# Patient Record
Sex: Male | Born: 1960 | Race: Black or African American | Hispanic: No | Marital: Single | Smoking: Former smoker
Health system: Southern US, Community
[De-identification: ages and names within clinical notes are randomized; demographics above are authoritative.]

---

## 2010-08-26 ENCOUNTER — Emergency Department (HOSPITAL_COMMUNITY): Payer: Self-pay

## 2010-08-26 ENCOUNTER — Emergency Department (HOSPITAL_COMMUNITY)
Admission: EM | Admit: 2010-08-26 | Discharge: 2010-08-26 | Disposition: A | Discharge status: Home / Self Care | Payer: Self-pay | Attending: Emergency Medicine | Admitting: Emergency Medicine

## 2010-08-26 DIAGNOSIS — M79609 Pain in unspecified limb: Secondary | ICD-10-CM | POA: Insufficient documentation

## 2010-08-26 DIAGNOSIS — S61409A Unspecified open wound of unspecified hand, initial encounter: Secondary | ICD-10-CM | POA: Insufficient documentation

## 2010-08-26 DIAGNOSIS — W269XXA Contact with unspecified sharp object(s), initial encounter: Secondary | ICD-10-CM | POA: Insufficient documentation

## 2010-08-26 DIAGNOSIS — Y92009 Unspecified place in unspecified non-institutional (private) residence as the place of occurrence of the external cause: Secondary | ICD-10-CM | POA: Insufficient documentation

## 2013-01-06 ENCOUNTER — Emergency Department (HOSPITAL_COMMUNITY)

## 2013-01-06 ENCOUNTER — Emergency Department (HOSPITAL_COMMUNITY)
Admission: EM | Admit: 2013-01-06 | Discharge: 2013-01-06 | Disposition: A | Discharge status: Home / Self Care | Attending: Emergency Medicine | Admitting: Emergency Medicine

## 2013-01-06 ENCOUNTER — Encounter (HOSPITAL_COMMUNITY): Payer: Self-pay | Admitting: Emergency Medicine

## 2013-01-06 DIAGNOSIS — F17200 Nicotine dependence, unspecified, uncomplicated: Secondary | ICD-10-CM | POA: Insufficient documentation

## 2013-01-06 DIAGNOSIS — S62319A Displaced fracture of base of unspecified metacarpal bone, initial encounter for closed fracture: Secondary | ICD-10-CM | POA: Insufficient documentation

## 2013-01-06 DIAGNOSIS — S62308A Unspecified fracture of other metacarpal bone, initial encounter for closed fracture: Secondary | ICD-10-CM

## 2013-01-06 MED ORDER — HYDROCODONE-ACETAMINOPHEN 5-325 MG PO TABS
1.0000 | ORAL_TABLET | Freq: Once | ORAL | Status: AC
  Administered 2013-01-06: 1 via ORAL
  Filled 2013-01-06: qty 1
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
## 2013-01-07 NOTE — ED Provider Notes (Signed)
Medical screening examination/treatment/procedure(s) were performed by non-physician practitioner and as supervising physician I was immediately available for consultation/collaboration.  Hurman Horn, MD 01/07/13 [phone]

## 2013-06-13 ENCOUNTER — Emergency Department (HOSPITAL_COMMUNITY)

## 2013-06-13 ENCOUNTER — Emergency Department (HOSPITAL_COMMUNITY)
Admission: EM | Admit: 2013-06-13 | Discharge: 2013-06-13 | Disposition: A | Discharge status: Home / Self Care | Attending: Emergency Medicine | Admitting: Emergency Medicine

## 2013-06-13 ENCOUNTER — Encounter (HOSPITAL_COMMUNITY): Payer: Self-pay | Admitting: Emergency Medicine

## 2013-06-13 DIAGNOSIS — IMO0002 Reserved for concepts with insufficient information to code with codable children: Secondary | ICD-10-CM | POA: Insufficient documentation

## 2013-06-13 DIAGNOSIS — Z23 Encounter for immunization: Secondary | ICD-10-CM | POA: Insufficient documentation

## 2013-06-13 DIAGNOSIS — S92402B Displaced unspecified fracture of left great toe, initial encounter for open fracture: Secondary | ICD-10-CM

## 2013-06-13 DIAGNOSIS — T148XXA Other injury of unspecified body region, initial encounter: Secondary | ICD-10-CM

## 2013-06-13 DIAGNOSIS — Y9389 Activity, other specified: Secondary | ICD-10-CM | POA: Insufficient documentation

## 2013-06-13 DIAGNOSIS — Y9289 Other specified places as the place of occurrence of the external cause: Secondary | ICD-10-CM | POA: Insufficient documentation

## 2013-06-13 DIAGNOSIS — Z880 Allergy status to penicillin: Secondary | ICD-10-CM | POA: Insufficient documentation

## 2013-06-13 DIAGNOSIS — F17200 Nicotine dependence, unspecified, uncomplicated: Secondary | ICD-10-CM | POA: Insufficient documentation

## 2013-06-13 DIAGNOSIS — S92919B Unspecified fracture of unspecified toe(s), initial encounter for open fracture: Secondary | ICD-10-CM | POA: Insufficient documentation

## 2013-06-13 MED ORDER — NAPROXEN 500 MG PO TABS: 500.0000 mg | ORAL_TABLET | Freq: Two times a day (BID) | ORAL | Status: DC

## 2013-06-13 MED ORDER — ACETAMINOPHEN ER 650 MG PO TBCR: 650.0000 mg | EXTENDED_RELEASE_TABLET | Freq: Three times a day (TID) | ORAL | Status: DC | PRN

## 2013-06-13 MED ORDER — OXYCODONE-ACETAMINOPHEN 5-325 MG PO TABS
2.0000 | ORAL_TABLET | Freq: Once | ORAL | Status: AC
  Administered 2013-06-13: 2 via ORAL
  Filled 2013-06-13: qty 2

## 2013-06-13 MED ORDER — CEPHALEXIN 500 MG PO CAPS: 500.0000 mg | ORAL_CAPSULE | Freq: Four times a day (QID) | ORAL | Status: DC

## 2013-06-13 MED ORDER — CEFAZOLIN SODIUM 1 G IJ SOLR
1.0000 g | Freq: Once | INTRAMUSCULAR | Status: AC
  Administered 2013-06-13: 1 g via INTRAMUSCULAR
  Filled 2013-06-13: qty 10

## 2013-06-13 MED ORDER — ONDANSETRON 4 MG PO TBDP
4.0000 mg | ORAL_TABLET | Freq: Once | ORAL | Status: AC
  Administered 2013-06-13: 4 mg via ORAL
  Filled 2013-06-13: qty 1

## 2013-06-13 MED ORDER — STERILE WATER FOR INJECTION IJ SOLN
INTRAMUSCULAR | Status: AC
  Administered 2013-06-13: 10 mL
  Filled 2013-06-13: qty 10

## 2013-06-13 MED ORDER — TETANUS-DIPHTH-ACELL PERTUSSIS 5-2.5-18.5 LF-MCG/0.5 IM SUSP
0.5000 mL | Freq: Once | INTRAMUSCULAR | Status: AC
  Administered 2013-06-13: 0.5 mL via INTRAMUSCULAR
  Filled 2013-06-13: qty 0.5

## 2013-06-13 NOTE — ED Notes (Signed)
Pt was going down some steps and states that he hit his toe and split the toe in two. Pt states that the tip of his toe is almost coming off across the nail. Pt believes that the toe is broken.

## 2013-06-13 NOTE — Progress Notes (Signed)
Orthopedic Tech Progress Note Patient Details:  Velora MediateKeith L XXXWalker 04/06/1960 [id number]  Ortho Devices Type of Ortho Device: Ace wrap;Watson Jones splint Ortho Device/Splint Location: LLE Ortho Device/Splint Interventions: Ordered;Application   Jennye MoccasinHughes, Oronde Hallenbeck Craig 06/13/2013, 5:46 PM

## 2013-06-13 NOTE — ED Provider Notes (Signed)
  Face-to-face evaluation   History: He injured the toe of his left foot, when he kicked a step.  Physical exam: Left great toe with 50% amputation. Over the IP joint. There is a distal deformity of flexion. Distal tip of the toe appears perfused.  Medical screening examination/treatment/procedure(s) were conducted as a shared visit with non-physician practitioner(s) and myself.  I personally evaluated the patient during the encounter  Flint MelterElliott L Nasif Bos, MD 06/16/13 1112

## 2013-06-13 NOTE — ED Notes (Signed)
Pt states that he has some swelling in the left leg as well.

## 2013-06-13 NOTE — ED Provider Notes (Signed)
CSN: [id number]     Arrival date & time 06/13/13  1254 History  This chart was scribed for non-physician practitioner, Matthew CaptainAbigail Matty Deamer, PA-C working with Matthew MelterElliott L Wentz, MD by Matthew Chapman, ED scribe. This patient was seen in room TR11C/TR11C and the patient's care was started at 2:41 PM.   Chief Complaint  Patient presents with  . Toe Pain   The history is provided by the patient. No language interpreter was used.   HPI Comments: Matthew Chapman is a 53 y.o. male who presents to the Emergency Department complaining of left great toe and leg injury that occurred around noon today. Pt states he tripped going up iron steps and hit his toe and left shin on one of the steps. He has sudden onset toe pain with associated swelling and left shin pain and swelling. Pt also has a laceration across his great toe. His last tetanus shot was in 2012.   No past medical history on file. No past surgical history on file. No family history on file. History  Substance Use Topics  . Smoking status: Current Every Day Smoker  . Smokeless tobacco: Not on file  . Alcohol Use: Yes    Review of Systems  Constitutional: Negative for fever.  HENT: Negative for congestion.   Eyes: Negative for redness.  Respiratory: Negative for shortness of breath.   Cardiovascular: Negative for chest pain.  Gastrointestinal: Negative for abdominal distention.  Musculoskeletal: Positive for arthralgias, joint swelling and myalgias.  Skin: Positive for wound.  Neurological: Negative for speech difficulty.  Psychiatric/Behavioral: Negative for confusion.   Allergies  Shellfish allergy and Penicillins  Home Medications  No current outpatient prescriptions on file.  BP 129/90  Pulse 80  Temp(Src) 98.1 F (36.7 C) (Oral)  SpO2 100%  Physical Exam  Nursing note and vitals reviewed. Constitutional: He is oriented to person, place, and time. He appears well-developed and well-nourished. No distress.  HENT:  Head:  Normocephalic and atraumatic.  Eyes: EOM are normal.  Neck: Neck supple. No tracheal deviation present.  Cardiovascular: Normal rate.   Pulmonary/Chest: Effort normal. No respiratory distress.  Musculoskeletal: Normal range of motion.  Neurological: He is alert and oriented to person, place, and time.  Skin: Skin is warm and dry.  4 cm jagged laceration of the dorsal surface of left big toe. It is is disarticulated with near through and through  Amputation of approximately 50 %. Skin is intact on the plantar surface. Capillary refill less than 3 seconds.   Psychiatric: He has a normal mood and affect. His behavior is normal.    ED Course  Procedures (including critical care time)  DIAGNOSTIC STUDIES: Oxygen Saturation is 100% on RA, normal by my interpretation.    COORDINATION OF CARE: 2:49 PM-Discussed treatment plan which includes orthopedic consult and leg xray with pt at bedside and pt agreed to plan.   LACERATION REPAIR PROCEDURE NOTE The patient's identification was confirmed and consent was obtained. This procedure was performed by Matthew CaptainAbigail Gonsalo Cuthbertson, PA-C at 3:47 PM. Site: left great toe Sterile procedures observed Anesthetic used (type and amt): 2.5 mL 0.5% bupivacaine with epi Suture type/size: 5-0 prolene Length: 4 cm # of Sutures: 9 Technique: simple interrupted Complexity: simple Tetanus UTD Site anesthetized, irrigated with NS, explored without evidence of foreign body, wound well approximated, site covered with dry, sterile dressing.  Patient tolerated procedure well without complications. Instructions for care discussed verbally and patient provided with additional written instructions for homecare and f/u.  Labs  Review Labs Reviewed - No data to display Imaging Review Dg Foot 2 Views Left  06/13/2013   CLINICAL DATA Left big toe injury  EXAM LEFT FOOT - 2 VIEW  COMPARISON None.  FINDINGS There is a transverse minimally displaced fracture of the base of the  first distal phalanx without articular surface involvement. There is no other fracture or dislocation. There severe surrounding soft tissue edema.  IMPRESSION 1. Transverse, minimally displaced fracture of the base of the first distal phalanx of the left foot without articular surface involvement.  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 Patient seen in shared visit with Matthew Chapman. The  Patient toe appears viable. I have reviewed the films with Dr. Renaye Chapman who asks that The patient be given 1g Ancef here in the ED and be discharged with pain medication. The patient has unknown childhood PCN allergy. I did speak to the pharmacist who did not have a record of the patient having taken previous cephalosporins. I again spoke with Dr. Eulah Chapman who felt they should be given and was given permision by my attending physician Matthew Chapman to proceed.  The patient tolerated the medication without incident. His toe was temporarily repaired with laceration repair. Suture through the nail plate helped to hold the disarticulated phalanx together. The digit had excellent appearing perfusion after repair. The patient was placed in a watson jones dressing. Strict instructions for NWB, supportive care, follow up and pain control were given to the medical staff at [facility] center with Strict return precautions. Patient discharged with keflex and non-narcotic pain medications.  I personally performed the services described in this documentation, which was scribed in my presence. The recorded information has been reviewed and is accurate.  Matthew Captain, PA-C 06/14/13 2125

## 2013-06-13 NOTE — Discharge Instructions (Signed)
The patient must not bear weight on the Left foot under any circumstances! He is to rest with his foot elevated above his heart and may get up for bathroom, bathing, and eating only. Apply ice to the foot and the left shin 30 min on, 30 min off through out the day. Medical staff must call first thing tomorrow morning. He will need to be seen by Dr. Renaye Rakers no later than 48 hours from his discharge.The patient needs to take his antibiotics as directed. Please take all antibiotics until finished!   If he develops abdominal discomfort or diarrhea from the antibiotic.  He  may take probiotics which he can get in yogurt. Do not eat or take the probiotics until 2 hours after your antibiotic.   Patient should take the medications prescribed as directed for pain control, if the patient's pain is unable to be controlled with non-narcotic medications he must return for pain management and admission to the hospital. The patient has a partial amputation of the toe which will require surgical repair. It is imperative that the patient is cared for properly and that he has the appropriate follow up as he is at risk for losing the toe otherwise.   Toe Injuries and Amputations You have cut off (amputated) part of your toe. Your outcome depends largely on how much was amputated. If just the tip is amputated, often the end of the toe will grow back and the toe may return much to the same as it was before the injury. If more of the toe is missing, your caregiver has done the best with the tissue remaining to allow you to keep as much toe as is possible or has finished the amputation at a level that will leave you with the most functional toe. This means a toe that will work the best for you. Please read the instructions outlined below and refer to this sheet in the next few weeks. These instructions provide you with general information on caring for yourself. Your caregiver may also give you specific instructions. While  your treatment has been done according to the most current medical practices available, unavoidable complications occasionally occur. If you have any problems or questions after discharge, call your caregiver. HOME CARE INSTRUCTIONS   You may resume a normal diet and activities as directed or allowed.  Keep your foot elevated when possible. This helps decrease pain and swelling.  Keep ice packs (a bag of ice wrapped in a towel) on the injured area for 15-20 minutes, 03-04 times per day, for the first two days. Use ice only if OK with your caregiver.  Change dressings if necessary or as directed.  Clean the wounded area as directed.  Only take over-the-counter or prescription medicines for pain, discomfort, or fever as directed by your caregiver.  Keep appointments as directed. SEEK IMMEDIATE MEDICAL CARE IF:  There is redness, swelling, numbness or increasing pain in the wound.  There is pus coming from wound.  You have an unexplained oral temperature above 102 F (38.9 C) or as your caregiver suggests.  There is a bad (foul) smell coming from the wound or dressing.  The edges of the wound break open (the edges are not staying together) after sutures or staples have been removed. Document Released: 02/11/2005 Document Revised: 06/15/2011 Document Reviewed: 07/11/2008 [facility] Patient Information 2014 [place], [place].  Toe Fracture Your caregiver has diagnosed you as having a fractured toe. A toe fracture is a break in the bone of  a toe. "Buddy taping" is a way of splinting your broken toe, by taping the broken toe to the toe next to it. This "buddy taping" will keep the injured toe from moving beyond normal range of motion. Buddy taping also helps the toe heal in a more normal alignment. It may take 6 to 8 weeks for the toe injury to heal. HOME CARE INSTRUCTIONS   Leave your toes taped together for as long as directed by your caregiver or until you see a doctor for a follow-up  examination. You can change the tape after bathing. Always use a small piece of gauze or cotton between the toes when taping them together. This will help the skin stay dry and prevent infection.  Apply ice to the injury for 15-20 minutes each hour while awake for the first 2 days. Put the ice in a plastic bag and place a towel between the bag of ice and your skin.  After the first 2 days, apply heat to the injured area. Use heat for the next 2 to 3 days. Place a heating pad on the foot or soak the foot in warm water as directed by your caregiver.  Keep your foot elevated as much as possible to lessen swelling.  Wear sturdy, supportive shoes. The shoes should not pinch the toes or fit tightly against the toes.  Your caregiver may prescribe a rigid shoe if your foot is very swollen.  Your may be given crutches if the pain is too great and it hurts too much to walk.  Only take over-the-counter or prescription medicines for pain, discomfort, or fever as directed by your caregiver.  If your caregiver has given you a follow-up appointment, it is very important to keep that appointment. Not keeping the appointment could result in a chronic or permanent injury, pain, and disability. If there is any problem keeping the appointment, you must call back to this facility for assistance. SEEK MEDICAL CARE IF:   You have increased pain or swelling, not relieved with medications.  The pain does not get better after 1 week.  Your injured toe is cold when the others are warm. SEEK IMMEDIATE MEDICAL CARE IF:   The toe becomes cold, numb, or white.  The toe becomes hot (inflamed) and red. Document Released: 03/20/2000 Document Revised: 06/15/2011 Document Reviewed: 11/07/2007 [facility] Patient Information 2014 [place].

## 2013-06-24 ENCOUNTER — Emergency Department (HOSPITAL_COMMUNITY)

## 2013-06-24 ENCOUNTER — Encounter (HOSPITAL_COMMUNITY): Payer: Self-pay | Admitting: Emergency Medicine

## 2013-06-24 ENCOUNTER — Emergency Department (HOSPITAL_COMMUNITY)
Admission: EM | Admit: 2013-06-24 | Discharge: 2013-06-24 | Disposition: A | Discharge status: Home / Self Care | Attending: Emergency Medicine | Admitting: Emergency Medicine

## 2013-06-24 DIAGNOSIS — Y929 Unspecified place or not applicable: Secondary | ICD-10-CM | POA: Insufficient documentation

## 2013-06-24 DIAGNOSIS — Z87891 Personal history of nicotine dependence: Secondary | ICD-10-CM | POA: Insufficient documentation

## 2013-06-24 DIAGNOSIS — Z791 Long term (current) use of non-steroidal anti-inflammatories (NSAID): Secondary | ICD-10-CM | POA: Insufficient documentation

## 2013-06-24 DIAGNOSIS — W108XXA Fall (on) (from) other stairs and steps, initial encounter: Secondary | ICD-10-CM | POA: Insufficient documentation

## 2013-06-24 DIAGNOSIS — S8012XA Contusion of left lower leg, initial encounter: Secondary | ICD-10-CM

## 2013-06-24 DIAGNOSIS — Z792 Long term (current) use of antibiotics: Secondary | ICD-10-CM | POA: Insufficient documentation

## 2013-06-24 DIAGNOSIS — Z8781 Personal history of (healed) traumatic fracture: Secondary | ICD-10-CM | POA: Insufficient documentation

## 2013-06-24 DIAGNOSIS — Z880 Allergy status to penicillin: Secondary | ICD-10-CM | POA: Insufficient documentation

## 2013-06-24 DIAGNOSIS — W010XXA Fall on same level from slipping, tripping and stumbling without subsequent striking against object, initial encounter: Secondary | ICD-10-CM | POA: Insufficient documentation

## 2013-06-24 DIAGNOSIS — S8010XA Contusion of unspecified lower leg, initial encounter: Secondary | ICD-10-CM | POA: Insufficient documentation

## 2013-06-24 DIAGNOSIS — Y9389 Activity, other specified: Secondary | ICD-10-CM | POA: Insufficient documentation

## 2013-06-24 MED ORDER — IBUPROFEN 400 MG PO TABS
400.0000 mg | ORAL_TABLET | Freq: Once | ORAL | Status: AC
  Administered 2013-06-24: 400 mg via ORAL
  Filled 2013-06-24: qty 1

## 2013-06-24 NOTE — ED Notes (Signed)
Here for reeval of left lower leg pain and swelling. Pain and swelling localized to left lower anterior leg

## 2013-06-24 NOTE — Discharge Instructions (Signed)

## 2013-06-24 NOTE — ED Provider Notes (Signed)
CSN: [id number]     Arrival date & time 06/24/13  1743 History   First MD Initiated Contact with Patient 06/24/13 1808     Chief Complaint  Patient presents with  . Leg Swelling    HPI Is a 53 year old male presents complaining of leg pain. He did about 10 days ago when he reportedly tripped going down steps although he told me he launched himself down steps. He was seen here for the initial event and was found to have a left great toe partial amputation. This was repaired in the ED. He was also complaining of pain to his left leg at that time, and x-ray was negative for fracture the leg. He did have a fracture of the toe which was treated as an open fracture, he was discharged with Keflex.  In the interim he's had ongoing pain to the left shin anteriorly. Pain is moderate in severity. It is worse when he touches his head. He is improved by nothing. He has not had any redness or warmth to the skin. He's not had any fever or chills. He did not have any lacerations or significant abrasions there when he initially injured himself. He has no other associated symptoms.    History reviewed. No pertinent past medical history. History reviewed. No pertinent past surgical history. No family history on file. History  Substance Use Topics  . Smoking status: Former Smoker    Types: Cigarettes    Quit date: 06/24/2009  . Smokeless tobacco: Never Used  . Alcohol Use: No    Review of Systems  Constitutional: Negative for fever and chills.  HENT: Negative for congestion and rhinorrhea.   Eyes: Negative for visual disturbance.  Respiratory: Negative for cough and shortness of breath.   Cardiovascular: Negative for chest pain and leg swelling.  Gastrointestinal: Negative for nausea, vomiting, abdominal pain and diarrhea.  Genitourinary: Negative for dysuria, urgency, frequency, flank pain and difficulty urinating.  Musculoskeletal: Negative for back pain, neck pain and neck stiffness.  Skin: Negative  for rash.  Neurological: Negative for syncope, weakness, numbness and headaches.  All other systems reviewed and are negative.      Allergies  Codeine; Penicillins; Shellfish allergy; Keflex; Naproxen; Tylenol; and Vicodin  Home Medications   Current Outpatient Rx  Name  Route  Sig  Dispense  Refill  . acetaminophen (TYLENOL) 650 MG CR tablet   Oral   Take 650 mg by mouth every 8 (eight) hours as needed for pain.         . cephALEXin (KEFLEX) 500 MG capsule   Oral   Take 500 mg by mouth 3 (three) times daily.          . naproxen (NAPROSYN) 500 MG tablet   Oral   Take 500 mg by mouth 2 (two) times daily with a meal.          BP 124/80  Pulse 71  Temp(Src) 98.2 F (36.8 C) (Oral)  Resp 18  Ht 5\' 6"  (1.676 m)  Wt 143 lb (64.864 kg)  BMI 23.09 kg/m2  SpO2 97% Physical Exam GENERAL: well developed, well nourished, no acute distress HEENT: atraumatic; normocephalic.  PERRL.  EOMI.  Sclera normal.  Mucus membranes moist.  Oropharynx clear.   NECK: supple.  No JVD.  Normal ROM. CARDIOVASCULAR: heart regular of rate and rhythm.  Normal heart sounds with no murmur, gallop, or rub.  Intact, strong, and bilaterally equal distal pulses.  Skin warm and dry.  No peripheral edema.  PULMONARY: chest clear to auscultation bilaterally.  No wheezes, rales, or rhonci.  Normal work of breathing. ABDOMEN: soft, non-distended, non-tender.  No pulsatile mass. GU: deferred MUSCULOSKELETAL: bony tenderness over left anterior tibia, mid-shaft.  Mild swelling overlying.  No laceration.  Soft compartments. Non-painful dorsiflexion and plantarflexion at the ankle.  Normal ROM at knee.  No tenderness at knee or ankle.  Healing laceration to left great toe.  No cellulitis.  No calf pain or swelling.  Otherwise atraumatic; no edema NEURO: alert and oriented X 4.  CN 2-12 intact.  5/5 strength in bilateral upper extremities and lower extremities, bilaterally symmetric.  SKIN: warm and dry with no  noted rash, abscess, or cellulitis PSYCH: normal mood and affect; normal memory to recent events  ED Course  Procedures (including critical care time) Labs Review Labs Reviewed - No data to display Imaging Review Dg Tibia/fibula Left  06/24/2013   CLINICAL DATA:  Continued mid shaft tibia/fibula pain  EXAM: LEFT TIBIA AND FIBULA - 2 VIEW  COMPARISON:  06/13/2013  FINDINGS: No fracture or dislocation is seen.  The joint spaces are preserved.  The visualized soft tissues are unremarkable.  IMPRESSION: No fracture or dislocation is seen.   Electronically Signed   By: Charline BillsSriyesh  Krishnan M.D.   On: 06/24/2013 19:38     EKG Interpretation None      MDM   53 year old male brought in in police custody. Complains of left leg pain. He fell several days ago down steps. X-rays were negative at that time. Complains of pain over his left shin. On exam he has mild swelling. Soft compartments. No pain with passive range of motion. He is able weight-bear. Remainder of exam is unremarkable. His toe laceration is healing well.  Repeat x-rays demonstrate no fracture.  Impression is contusion to the left leg. Advised elevation, ice, rest. NSAIDs for pain control. Discharge in police custody.  Final diagnoses:  Contusion of left leg      Toney SangJerrid Toivo Bordon, MD 06/25/13 0028

## 2013-06-25 NOTE — ED Provider Notes (Signed)
I saw and evaluated the patient, reviewed the resident's note and I agree with the findings and plan.   EKG Interpretation None      Pt c/o contusion to left lower leg 1-2 weeks ago, states persistent pain, constant, dull. No acute or abrupt worsening of pain or reinjury. No lower leg or foot numbenss/weakness. Contusion w mild sts noted to mid lower leg medially. Localized tenderness to area. w rom at knee and dorsi and plantar flexion at ankle no increased in pain or severe pain. Compartments appear relatively soft/not tense. Distal pulses palp. Pain controlled.   Suzi RootsKevin E Demiah Gullickson, MD 06/25/13 [phone]

## 2015-03-18 IMAGING — CR DG HAND COMPLETE 3+V*R*
3 series · 3 of 3 positions shown · non-contrast
Comparison: 08/26/2010

CLINICAL DATA: Right hand pain and swelling, trauma 3 days ago

EXAM:
RIGHT HAND - COMPLETE 3+ VIEW

[x hand pa right]
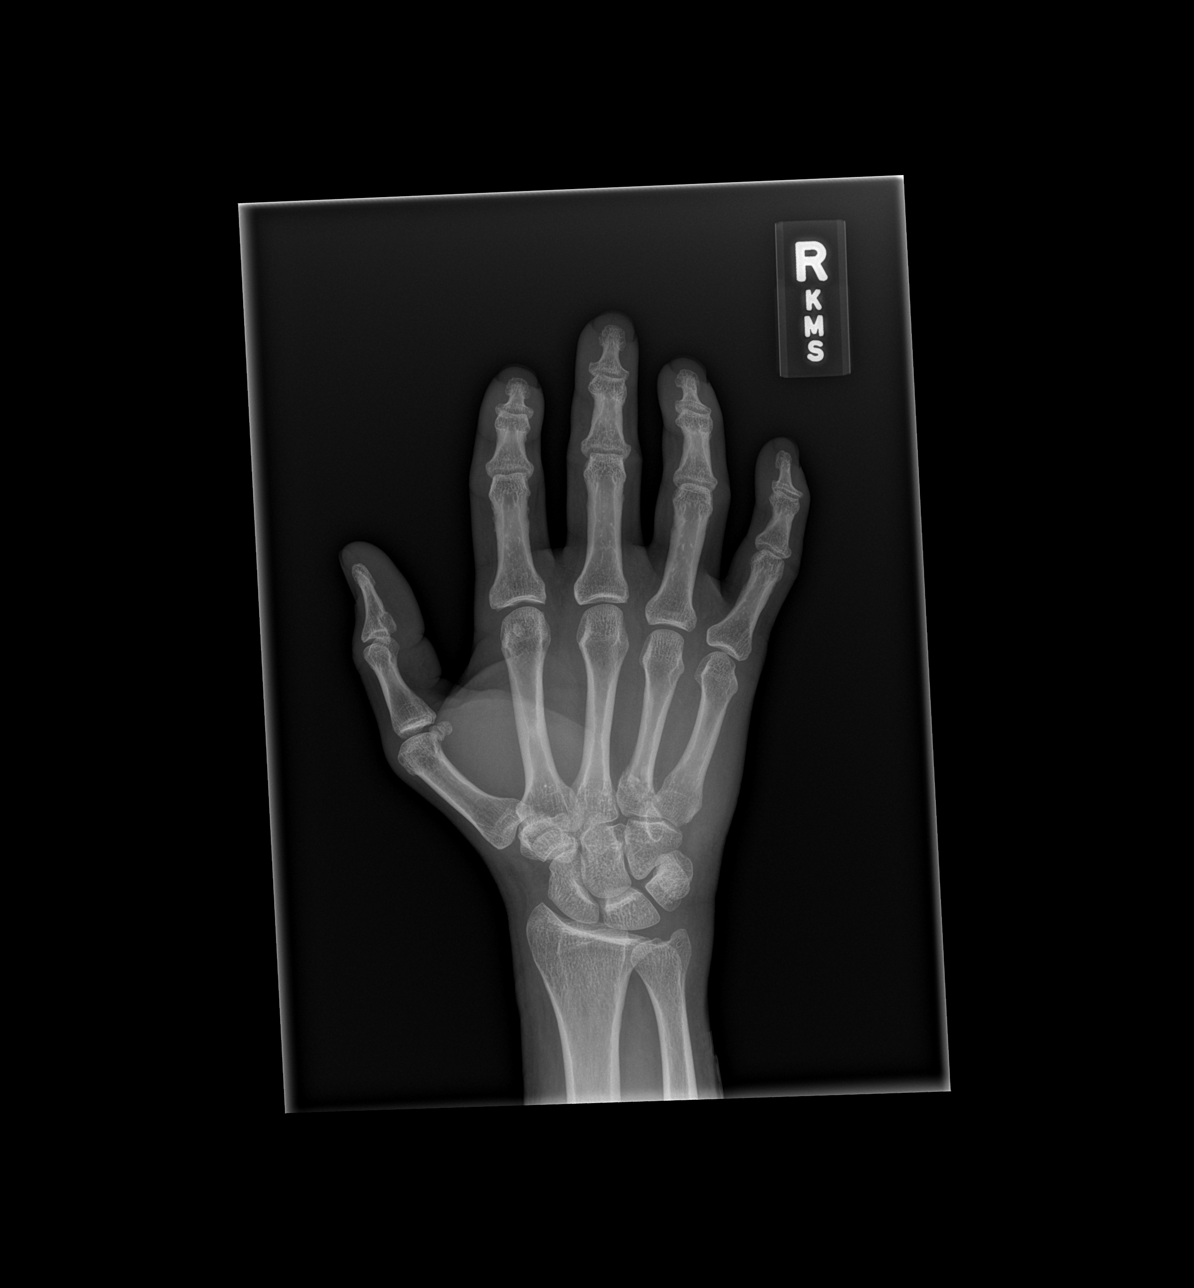

[x hand obl right]
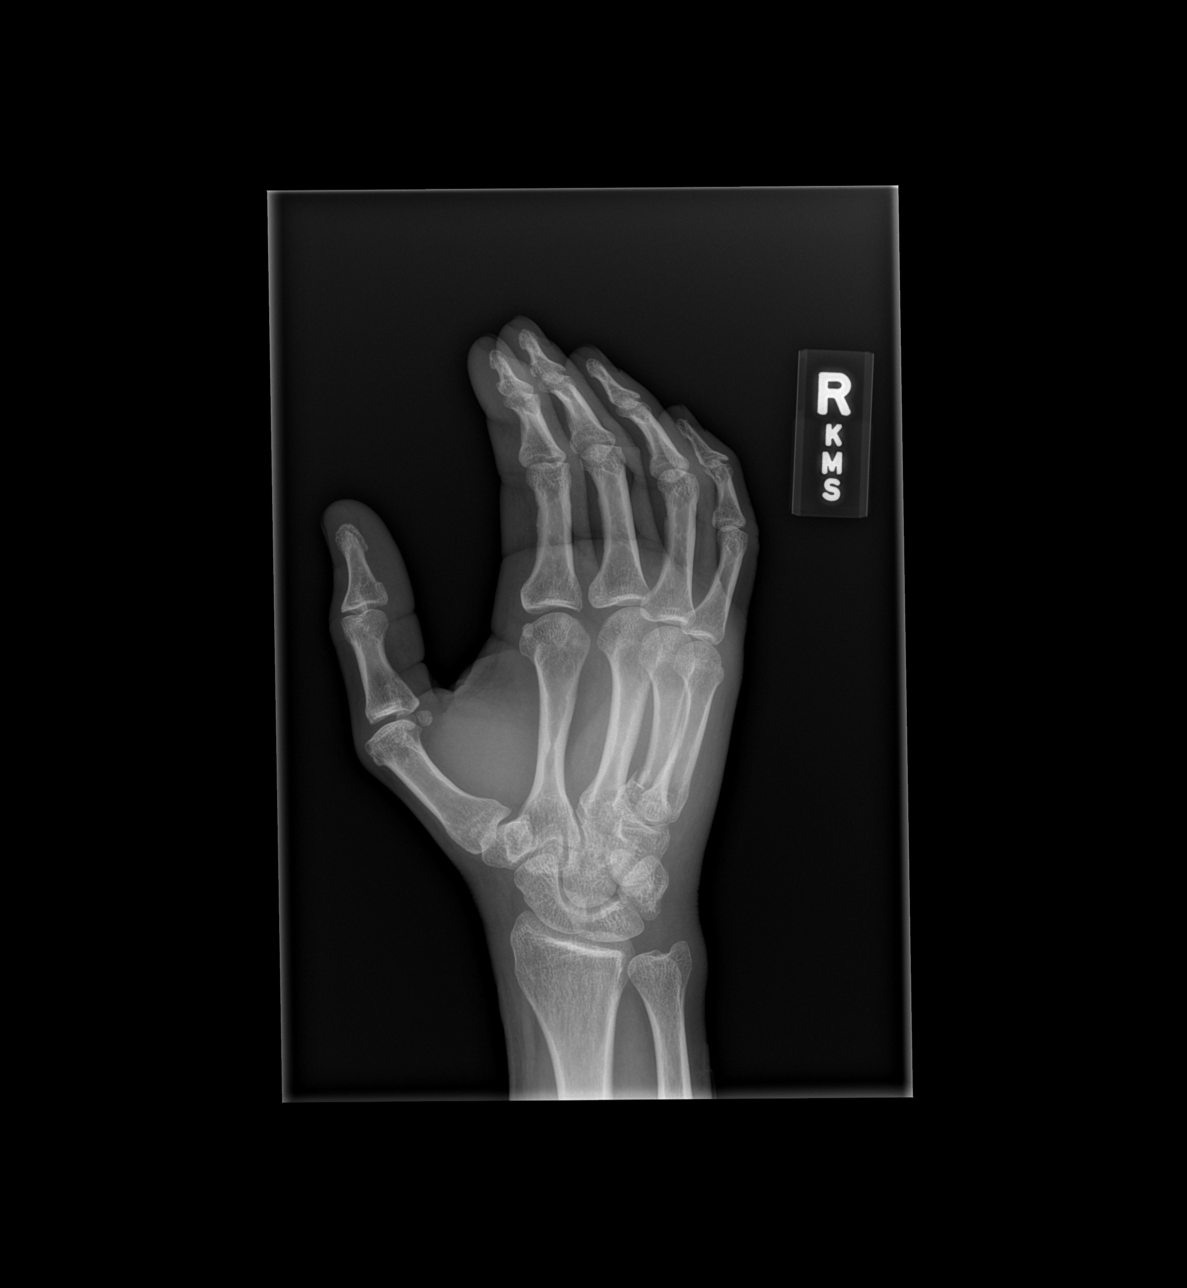

[x hand lat right]
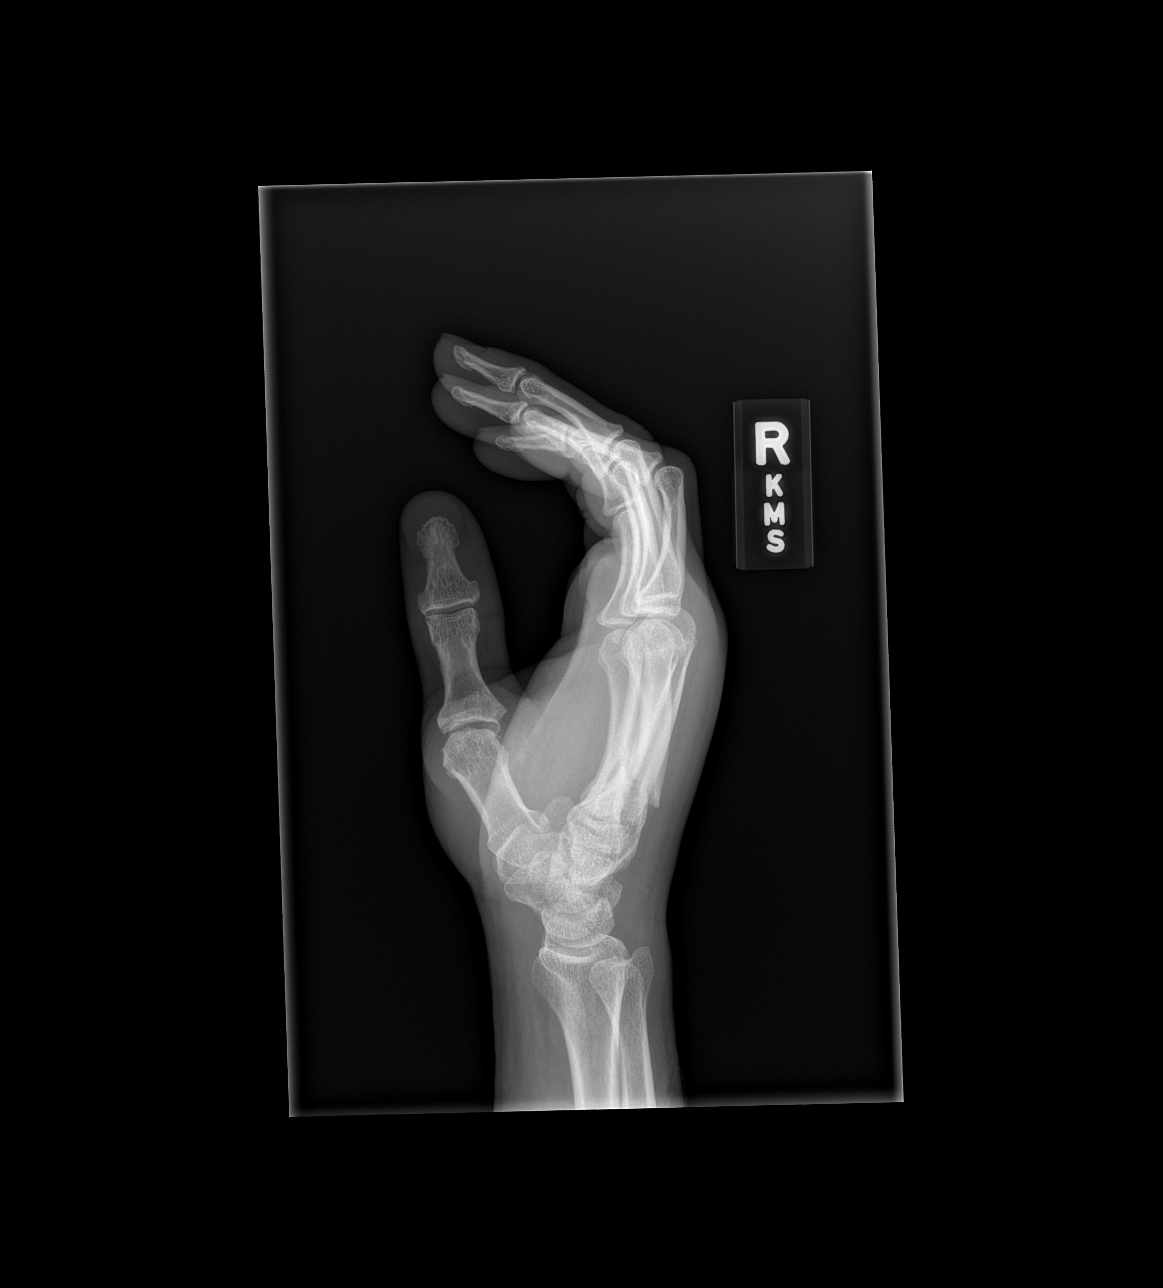

[3 of 3 positions shown; findings below may reference images not displayed]

FINDINGS: There is an oblique fracture of the base of the 4th metacarpal. No
radiopaque foreign body. Mild dorsal displacement of the distal
fracture fragment noted.
IMPRESSION: Base of 4th metacarpal fracture.

## 2019-08-17 ENCOUNTER — Emergency Department (HOSPITAL_COMMUNITY): Payer: Self-pay

## 2019-08-17 ENCOUNTER — Emergency Department (HOSPITAL_COMMUNITY)
Admission: EM | Admit: 2019-08-17 | Discharge: 2019-08-17 | Disposition: A | Discharge status: Home / Self Care | Payer: Self-pay | Attending: Emergency Medicine | Admitting: Emergency Medicine

## 2019-08-17 ENCOUNTER — Encounter (HOSPITAL_COMMUNITY): Payer: Self-pay | Admitting: Emergency Medicine

## 2019-08-17 ENCOUNTER — Other Ambulatory Visit: Payer: Self-pay

## 2019-08-17 DIAGNOSIS — Z87891 Personal history of nicotine dependence: Secondary | ICD-10-CM | POA: Insufficient documentation

## 2019-08-17 DIAGNOSIS — Y999 Unspecified external cause status: Secondary | ICD-10-CM | POA: Insufficient documentation

## 2019-08-17 DIAGNOSIS — Y939 Activity, unspecified: Secondary | ICD-10-CM | POA: Insufficient documentation

## 2019-08-17 DIAGNOSIS — W540XXA Bitten by dog, initial encounter: Secondary | ICD-10-CM | POA: Insufficient documentation

## 2019-08-17 DIAGNOSIS — S62326B Displaced fracture of shaft of fifth metacarpal bone, right hand, initial encounter for open fracture: Secondary | ICD-10-CM | POA: Insufficient documentation

## 2019-08-17 DIAGNOSIS — Z23 Encounter for immunization: Secondary | ICD-10-CM | POA: Insufficient documentation

## 2019-08-17 DIAGNOSIS — Y92039 Unspecified place in apartment as the place of occurrence of the external cause: Secondary | ICD-10-CM | POA: Insufficient documentation

## 2019-08-17 MED ORDER — HYDROMORPHONE HCL 1 MG/ML IJ SOLN
1.0000 mg | Freq: Once | INTRAMUSCULAR | Status: AC
  Administered 2019-08-17: 1 mg via INTRAMUSCULAR
  Filled 2019-08-17: qty 1

## 2019-08-17 MED ORDER — CLINDAMYCIN HCL 150 MG PO CAPS: 450.0000 mg | ORAL_CAPSULE | Freq: Three times a day (TID) | ORAL | 0 refills | Status: DC

## 2019-08-17 MED ORDER — TETANUS-DIPHTH-ACELL PERTUSSIS 5-2.5-18.5 LF-MCG/0.5 IM SUSP
0.5000 mL | Freq: Once | INTRAMUSCULAR | Status: AC
  Administered 2019-08-17: 0.5 mL via INTRAMUSCULAR
  Filled 2019-08-17: qty 0.5

## 2019-08-17 MED ORDER — DOXYCYCLINE HYCLATE 100 MG PO CAPS
100.0000 mg | ORAL_CAPSULE | Freq: Two times a day (BID) | ORAL | 0 refills | Status: DC
Start: 2019-08-17 — End: 2023-06-28

## 2019-08-17 MED ORDER — RABIES IMMUNE GLOBULIN 150 UNIT/ML IM INJ
20.0000 [IU]/kg | INJECTION | Freq: Once | INTRAMUSCULAR | Status: DC
  Filled 2019-08-17: qty 8

## 2019-08-17 MED ORDER — MORPHINE SULFATE 30 MG PO TABS: 30.0000 mg | ORAL_TABLET | ORAL | 0 refills | Status: DC | PRN

## 2019-08-17 MED ORDER — RABIES VACCINE, PCEC IM SUSR
1.0000 mL | Freq: Once | INTRAMUSCULAR | Status: DC
  Filled 2019-08-17: qty 1

## 2019-08-17 NOTE — ED Triage Notes (Signed)
Pt reports was pulling two dogs off a kid at his apartment complex when he got bit on right hand. Unknown owner of dogs or if they are vaccinated.

## 2019-08-17 NOTE — Discharge Instructions (Signed)
Start Clindamycin and Doxycycline to prevent infection Take Morphine every 4 hours as needed for pain Please follow up with Dr. Merrilee Seashore office - call tomorrow for an appointment next week Return to the ER if you are worsening

## 2019-08-17 NOTE — ED Notes (Signed)
Pt's wound cleaned with saline, dried for ortho-tech.

## 2019-08-17 NOTE — ED Provider Notes (Signed)
[facility] DEPT Provider Note   CSN: [id number] Arrival date & time: 08/17/19  1219   History Chief Complaint  Patient presents with  . Animal Bite    Matthew Chapman is a 59 y.o. male who is RHD presents with right hand injury. He states that at his apartment complex there are dogs roaming around and they were attacking a child. He ran over and started punching one of the dogs in the head. He states he punched it 4 times and then the 5th time the dog turned around and he punched it in the mouth which caused a laceration over the lateral aspect of the hand. He is having severe 10/10 hand pain and the area keeps bleeding. He is not UTD on tetanus. He has been NPO since 9am  HPI     History reviewed. No pertinent past medical history.  There are no problems to display for this patient.   History reviewed. No pertinent surgical history.     No family history on file.  Social History   Tobacco Use  . Smoking status: Former Smoker    Types: Cigarettes    Quit date: 06/24/2009    Years since quitting: 10.1  . Smokeless tobacco: Never Used  Substance Use Topics  . Alcohol use: No  . Drug use: No    Home Medications Prior to Admission medications   Not on File    Allergies    Codeine, Penicillins, Shellfish allergy, Keflex [cephalexin], Naproxen, Tylenol [acetaminophen], Vicodin [hydrocodone-acetaminophen], Aspirin, Ibuprofen, and Sudafed [pseudoephedrine]  Review of Systems   Review of Systems  Musculoskeletal: Positive for arthralgias.  Skin: Positive for wound.  Neurological: Negative for weakness and numbness.  All other systems reviewed and are negative.   Physical Exam Updated Vital Signs BP (!) 154/94 (BP Location: Left Arm)   Pulse 64   Temp 98 F (36.7 C) (Oral)   Resp (!) 21   Physical Exam Vitals and nursing note reviewed.  Constitutional:      General: He is not in acute distress.    Appearance: Normal appearance.  He is well-developed. He is not ill-appearing.  HENT:     Head: Normocephalic and atraumatic.  Eyes:     General: No scleral icterus.       Right eye: No discharge.        Left eye: No discharge.     Conjunctiva/sclera: Conjunctivae normal.     Pupils: Pupils are equal, round, and reactive to light.  Cardiovascular:     Rate and Rhythm: Normal rate.  Pulmonary:     Effort: Pulmonary effort is normal. No respiratory distress.  Abdominal:     General: There is no distension.  Musculoskeletal:     Cervical back: Normal range of motion.     Comments: Right hand: Focal area of swelling over the dorsal aspect of the hand just proximal to the 5th MCP joint with associated overlying 1cm laceration. ROM deferred. N/V intact  Skin:    General: Skin is warm and dry.  Neurological:     Mental Status: He is alert and oriented to person, place, and time.  Psychiatric:        Behavior: Behavior normal.       ED Results / Procedures / Treatments   Labs (all labs ordered are listed, but only abnormal results are displayed) Labs Reviewed - No data to display  EKG None  Radiology DG Hand Complete Right  Result Date: 08/17/2019 CLINICAL  DATA:  Pain following dog bite EXAM: RIGHT HAND - COMPLETE 3+ VIEW COMPARISON:  January 06, 2013. FINDINGS: Frontal, oblique, and lateral views were obtained. There is a comminuted fracture of the distal fifth metacarpal with volar angulation distally. There is also mild impaction at the fracture site in this area. No other fracture. No dislocation. There is flexion of the second and fifth PIP and DIP joints. No appreciable joint space narrowing or erosion. No radiopaque foreign body or soft tissue air. IMPRESSION: Comminuted fracture of the fifth metacarpal distally with impaction and volar angulation distally at the fracture site. No other fracture. No dislocation. No joint space narrowing. No radiopaque foreign body or soft tissue air. Electronically Signed    By: Bretta Bang III M.D.   On: 08/17/2019 13:15    Procedures Procedures (including critical care time)  Medications Ordered in ED Medications  Tdap (BOOSTRIX) injection 0.5 mL (has no administration in time range)  rabies vaccine (RABAVERT) injection 1 mL (has no administration in time range)  rabies immune globulin (HYPERAB/KEDRAB) injection 20 Units/kg (has no administration in time range)  HYDROmorphone (DILAUDID) injection 1 mg (has no administration in time range)    ED Course  I have reviewed the triage vital signs and the nursing notes.  Pertinent labs & imaging results that were available during my care of the patient were reviewed by me and considered in my medical decision making (see chart for details).  59 year old male presents with possible open fracture after punching the dog and it biting him on the hand.  Patient has obvious swelling over the fifth metacarpal with an associated laceration.  X-ray shows a comminuted and displaced fracture of the shaft of the fifth metacarpal.  Since there is concern for open fracture will consult orthopedics.  Tdap was updated.  Patient was recommended to have rabies series since the dogs are strays and are not currently being quarantined however he is refusing this.  Discussed with [facility] [place] who will talk to Dr. Rosanne Sack surgery is recommending oral antibiotics, ulnar gutter splint, and follow-up in the office.  Patient reports a severe penicillin allergy therefore will place him on clindamycin and doxycycline.  He was sent in a prescription for pain medicine as well and advised to call Dr. Merrilee Seashore office tomorrow for an appointment next week.  He was advised to return for worsening symptoms  MDM Rules/Calculators/A&P                       Final Clinical Impression(s) / ED Diagnoses Final diagnoses:  Dog bite of right hand, initial encounter  Open displaced fracture of shaft of fifth metacarpal bone of right hand,  initial encounter    Rx / DC Orders ED Discharge Orders    None       Bethel Born, PA-C 08/17/19 1831    Maia Plan, MD 08/18/19 [phone]

## 2019-08-17 NOTE — Progress Notes (Signed)
TOC CM spoke to pt and provided pt with [facility] letter. States he does not have insurance. Explained he can utilize once per year with a $3 copay excludes Morphine. Will follow up with specialist for his hand. Isidoro Donning RN CCM, [facility] ED TOC CM [phone]

## 2019-08-17 NOTE — ED Notes (Signed)
Pt refused rabies vaccine. He says he thinks his hand is bleeding after hitting the dog on the head. Risks were explained if he does not receive vaccine and was bitten by dog with rabies. He verbalized understanding.

## 2019-08-17 NOTE — ED Notes (Signed)
Case manager at bedside and has explained to pt that with the prescription program offered he will be able to pick up his antibiotics from his pharmacy for 3 dollars per medication. It was also explained that many narcotic medication is not covered and would need to be paid for at full price. Pt states he understood

## 2019-08-18 ENCOUNTER — Emergency Department (HOSPITAL_COMMUNITY)
Admission: EM | Admit: 2019-08-18 | Discharge: 2019-08-18 | Disposition: A | Discharge status: Home / Self Care | Payer: Self-pay | Attending: Emergency Medicine | Admitting: Emergency Medicine

## 2019-08-18 ENCOUNTER — Encounter (HOSPITAL_COMMUNITY): Payer: Self-pay

## 2019-08-18 ENCOUNTER — Other Ambulatory Visit: Payer: Self-pay

## 2019-08-18 DIAGNOSIS — R6889 Other general symptoms and signs: Secondary | ICD-10-CM | POA: Insufficient documentation

## 2019-08-18 DIAGNOSIS — Z5321 Procedure and treatment not carried out due to patient leaving prior to being seen by health care provider: Secondary | ICD-10-CM | POA: Insufficient documentation

## 2019-08-18 NOTE — ED Notes (Signed)
Patient called x2 for room placement without answer. 

## 2019-08-18 NOTE — ED Notes (Signed)
Patient called for room placement x3 with no answer. 

## 2019-08-18 NOTE — ED Triage Notes (Signed)
Patient states that he was seen yesterday. Patient states, "I only $6 and I got my 2 antibiotics, but I did not have Money for my pain medication. I am in so much pain. The pain medicine cost $25.00 and I did not have the money."

## 2019-08-18 NOTE — ED Notes (Signed)
Patient called for room placement x1 with no answer. 

## 2023-06-28 ENCOUNTER — Ambulatory Visit: Payer: Medicaid Other | Admitting: Family Medicine

## 2023-06-28 ENCOUNTER — Encounter: Payer: Self-pay | Admitting: Family Medicine

## 2023-06-28 ENCOUNTER — Ambulatory Visit (INDEPENDENT_AMBULATORY_CARE_PROVIDER_SITE_OTHER): Payer: Self-pay

## 2023-06-28 VITALS — BP 110/70 | HR 62 | Temp 98.4°F | Ht 66.0 in | Wt 136.4 lb

## 2023-06-28 DIAGNOSIS — Z136 Encounter for screening for cardiovascular disorders: Secondary | ICD-10-CM

## 2023-06-28 DIAGNOSIS — M25512 Pain in left shoulder: Secondary | ICD-10-CM

## 2023-06-28 DIAGNOSIS — Z6822 Body mass index (BMI) 22.0-22.9, adult: Secondary | ICD-10-CM

## 2023-06-28 DIAGNOSIS — G8929 Other chronic pain: Secondary | ICD-10-CM

## 2023-06-28 DIAGNOSIS — M25562 Pain in left knee: Secondary | ICD-10-CM

## 2023-06-28 DIAGNOSIS — Z720 Tobacco use: Secondary | ICD-10-CM | POA: Diagnosis not present

## 2023-06-28 DIAGNOSIS — Z125 Encounter for screening for malignant neoplasm of prostate: Secondary | ICD-10-CM

## 2023-06-28 LAB — LIPID PANEL
Cholesterol: 209 mg/dL — ABNORMAL HIGH (ref 0–200)
HDL: 48.7 mg/dL (ref >39.00)
LDL Cholesterol: 133 mg/dL — ABNORMAL HIGH (ref 0–99)
NonHDL: 160.43
Total CHOL/HDL Ratio: 4
Triglycerides: 137 mg/dL (ref 0.0–149.0)
VLDL: 27.4 mg/dL (ref 0.0–40.0)

## 2023-06-28 LAB — CBC WITH DIFFERENTIAL/PLATELET
Basophils Absolute: 0.1 10*3/uL (ref 0.0–0.1)
Basophils Relative: 0.8 % (ref 0.0–3.0)
Eosinophils Absolute: 0.3 10*3/uL (ref 0.0–0.7)
Eosinophils Relative: 3.9 % (ref 0.0–5.0)
HCT: 37.3 % — ABNORMAL LOW (ref 39.0–52.0)
Hemoglobin: 12.1 g/dL — ABNORMAL LOW (ref 13.0–17.0)
Lymphocytes Relative: 27.2 % (ref 12.0–46.0)
Lymphs Abs: 1.9 10*3/uL (ref 0.7–4.0)
MCHC: 32.6 g/dL (ref 30.0–36.0)
MCV: 80.7 fl (ref 78.0–100.0)
Monocytes Absolute: 0.7 10*3/uL (ref 0.1–1.0)
Monocytes Relative: 10.1 % (ref 3.0–12.0)
Neutro Abs: 4.1 10*3/uL (ref 1.4–7.7)
Neutrophils Relative %: 58 % (ref 43.0–77.0)
Platelets: 306 10*3/uL (ref 150.0–400.0)
RBC: 4.61 Mil/uL (ref 4.22–5.81)
RDW: 13.8 % (ref 11.5–15.5)
WBC: 7.1 10*3/uL (ref 4.0–10.5)

## 2023-06-28 LAB — COMPREHENSIVE METABOLIC PANEL
ALT: 14 U/L (ref 0–53)
AST: 18 U/L (ref 0–37)
Albumin: 4.5 g/dL (ref 3.5–5.2)
Alkaline Phosphatase: 48 U/L (ref 39–117)
BUN: 10 mg/dL (ref 6–23)
CO2: 31 meq/L (ref 19–32)
Calcium: 9.8 mg/dL (ref 8.4–10.5)
Chloride: 103 meq/L (ref 96–112)
Creatinine, Ser: 0.81 mg/dL (ref 0.40–1.50)
GFR: 94.02 mL/min (ref >60.00)
Glucose, Bld: 91 mg/dL (ref 70–99)
Potassium: 3.7 meq/L (ref 3.5–5.1)
Sodium: 140 meq/L (ref 135–145)
Total Bilirubin: 0.6 mg/dL (ref 0.2–1.2)
Total Protein: 7.4 g/dL (ref 6.0–8.3)

## 2023-06-28 LAB — PSA: PSA: 4.32 ng/mL — ABNORMAL HIGH (ref 0.10–4.00)

## 2023-06-28 NOTE — Patient Instructions (Addendum)
 Welcome to [facility]!  Thank you for choosing [place] for your Primary Care needs.   We offer in person and video appointments for your convenience. You may call our office to schedule appointments, or you may schedule appointments with me through MyChart.   The best way to get in contact with me is via MyChart message. This will get to me faster than a phone call, unless there is an emergency, then please call 911.  The lab is located downstairs in the Sports Medicine building, we also have xray available there.   We are getting an xray today. We will be in contact with any abnormal results that require further attention.  We are checking labs today, will be in contact with any results that require further attention  Ask Juliette Alcide about disability paperwork.  Follow up with me in about 6 mos for labs and physical.

## 2023-06-28 NOTE — Progress Notes (Signed)
 New Patient Office Visit  Subjective    Patient ID: Matthew Chapman, male    DOB: Aug 07, 1960  Age: 63 y.o. MRN: [id number]  CC:  Chief Complaint  Patient presents with   Establish Care    HPI Matthew Chapman presents to establish care today. Reports chronic left shoulder and left knee pain for years.  Reports numbness, tingling to the LLE for years as well. Worse when sitting, better when lying. Has not been insured so he has not been able to seek care for his pain. Inquiring about disability as he has not been able to work due to his pain.  Has not had consistent care.  Agreeable to lab work today. Needs to schedule physical. Current smoker, not ready to quit.  Denies current abdominal pain, nausea, vomiting, rash, diarrhea, fever, chills, other symptoms.  Medical hx as outlined below.   Outpatient Encounter Medications as of 06/28/2023  Medication Sig   morphine (MSIR) 30 MG tablet Take 1 tablet (30 mg total) by mouth every 4 (four) hours as needed for severe pain.   [DISCONTINUED] clindamycin (CLEOCIN) 150 MG capsule Take 3 capsules (450 mg total) by mouth 3 (three) times daily. (Patient not taking: Reported on 06/28/2023)   [DISCONTINUED] doxycycline (VIBRAMYCIN) 100 MG capsule Take 1 capsule (100 mg total) by mouth 2 (two) times daily. (Patient not taking: Reported on 06/28/2023)   No facility-administered encounter medications on file as of 06/28/2023.    History reviewed. No pertinent past medical history.  History reviewed. No pertinent surgical history.  Family History  Problem Relation Age of Onset   Diabetes Father     Social History   Socioeconomic History   Marital status: Single    Spouse name: Not on file   Number of children: Not on file   Years of education: Not on file   Highest education level: Not on file  Occupational History   Not on file  Tobacco Use   Smoking status: Former    Current packs/day: 0.00    Types: Cigarettes    Quit date:  06/24/2009    Years since quitting: 14.0   Smokeless tobacco: Never  Vaping Use   Vaping status: Never Used  Substance and Sexual Activity   Alcohol use: No   Drug use: No   Sexual activity: Yes  Other Topics Concern   Not on file  Social History Narrative   Not on file   Social Drivers of Health   Financial Resource Strain: Not on file  Food Insecurity: Not on file  Transportation Needs: Not on file  Physical Activity: Not on file  Stress: Not on file  Social Connections: Not on file  Intimate Partner Violence: Not on file    ROS Per HPI      Objective    BP 110/70 (BP Location: Left Arm, Patient Position: Sitting)   Pulse 62   Temp 98.4 F (36.9 C) (Temporal)   Ht 5\' 6"  (1.676 m)   Wt 136 lb 6.4 oz (61.9 kg)   SpO2 99%   BMI 22.02 kg/m   Physical Exam Vitals and nursing note reviewed.  Constitutional:      General: He is not in acute distress.    Appearance: Normal appearance.  HENT:     Head: Normocephalic and atraumatic.     Nose: Nose normal.  Eyes:     Extraocular Movements: Extraocular movements intact.  Cardiovascular:     Rate and Rhythm: Normal rate and regular  rhythm.     Pulses:          Dorsalis pedis pulses are 1+ on the left side.     Heart sounds: Normal heart sounds.  Pulmonary:     Effort: Pulmonary effort is normal. No respiratory distress.     Breath sounds: Normal breath sounds. No wheezing, rhonchi or rales.  Musculoskeletal:     Cervical back: Normal range of motion.     Comments: LROM to L shoulder, LROM to L knee. No swelling, erythema, bruising or obvious deformity to either area.   Feet:     Right foot:     Protective Sensation: 10 sites tested.       Left foot:     Protective Sensation: 10 sites tested.  7 sites sensed.  Lymphadenopathy:     Cervical: No cervical adenopathy.  Skin:    General: Skin is warm and dry.  Neurological:     General: No focal deficit present.     Mental Status: He is alert and oriented to  person, place, and time.  Psychiatric:        Mood and Affect: Mood normal.        Behavior: Behavior normal.         Assessment & Plan:   BMI 22.0-22.9, adult  Chronic left shoulder pain -     Ambulatory referral to Orthopedic Surgery -     DG Shoulder Left; Future  Chronic pain of left knee -     Ambulatory referral to Orthopedic Surgery -     DG Knee Complete 4 Views Left; Future  Prostate cancer screening -     PSA  Encounter for screening for cardiovascular disorders -     CBC with Differential/Platelet -     Comprehensive metabolic panel -     Lipid panel  Tobacco use -     CBC with Differential/Platelet -     Comprehensive metabolic panel     Return in about 6 months (around 12/29/2023) for labs, physical.   Moshe Cipro, FNP

## 2023-06-30 ENCOUNTER — Telehealth: Payer: Self-pay | Admitting: Family Medicine

## 2023-06-30 NOTE — Telephone Encounter (Signed)
 Spoke with patients clinical case Production designer, theatre/television/film, Juliette Alcide. She will try to reach out to social services herself to get more information, as we do not provide these forms they are normally sent to [place].

## 2023-06-30 NOTE — Telephone Encounter (Signed)
 Copied from CRM [id number]. Topic: General - Other >> Jun 30, 2023  1:52 PM Florestine Avers wrote: Reason for CRM: Patient states that NP Moshe Cipro needs to send him forms for disability as well as his medical records. The social security office does not provide the forms anymore they told him to get it from her. Patient is requesting a call back. He needs a copy of his medical records, copy of the xray that was done.

## 2023-07-02 ENCOUNTER — Ambulatory Visit: Admitting: Family Medicine

## 2023-07-26 ENCOUNTER — Ambulatory Visit: Admitting: Physician Assistant

## 2023-08-04 ENCOUNTER — Other Ambulatory Visit (INDEPENDENT_AMBULATORY_CARE_PROVIDER_SITE_OTHER): Payer: Self-pay

## 2023-08-04 ENCOUNTER — Ambulatory Visit (INDEPENDENT_AMBULATORY_CARE_PROVIDER_SITE_OTHER): Admitting: Physician Assistant

## 2023-08-04 ENCOUNTER — Encounter: Payer: Self-pay | Admitting: Physician Assistant

## 2023-08-04 DIAGNOSIS — G8929 Other chronic pain: Secondary | ICD-10-CM

## 2023-08-04 DIAGNOSIS — M542 Cervicalgia: Secondary | ICD-10-CM | POA: Diagnosis not present

## 2023-08-04 DIAGNOSIS — M25512 Pain in left shoulder: Secondary | ICD-10-CM

## 2023-08-04 DIAGNOSIS — M25562 Pain in left knee: Secondary | ICD-10-CM

## 2023-08-04 NOTE — Progress Notes (Signed)
 Office Visit Note   Patient: Matthew Chapman           Date of Birth: [birth year]           MRN: [id number] Visit Date: 08/04/2023              Requested by: Matthew Half, FNP [address] [place],  [place] [address] PCP: Matthew Half, FNP   Assessment & Plan: Visit Diagnoses:  1. Neck pain   2. Chronic left shoulder pain   3. Chronic pain of left knee   4. Cervicalgia     Plan: We will obtain an MRI of his C-spine once to evaluate soft tissue and will be with contrast rule out soft tissue gas.  After the MRI he will follow-up with us  approximately 5 days later.  If this is negative for any type of infectious process would recommend cortisone injection left shoulder and left knee.  In the interim he will work on shoulder exercises and quad strengthening exercises as shown.  Questions were encouraged and answered at length.  Follow-Up Instructions: Return After MRI of the neck.   Orders:  Orders Placed This Encounter  Procedures   XR Cervical Spine 2 or 3 views   XR Knee 1-2 Views Left   MR CERVICAL SPINE W CONTRAST   No orders of the defined types were placed in this encounter.     Procedures: No procedures performed   Clinical Data: No additional findings.   Subjective: Chief Complaint  Patient presents with   Left Shoulder - Pain   Left Knee - Pain    HPI Matthew Chapman 63 year old male was seen for the first time.  He has multiple complaints left shoulder, left knee and history of left neck and jaw swelling.  He was seen in the [place] ER on 05/15/2023 due to swelling of his jaw and neck diagnosed with a dental infection was placed on Cleocin  for 10 days swelling did go away.  Reports he has seen his dentist since then and there was no dental infection.  At the ER he did undergo radiographs which showed a small lucency anterior to the C5 vertebral body and is raised concern for prevertebral soft tissue gas.  CT soft tissue of the neck  with contrast was recommended however due to some technical issues this was not obtained.  Patient denies any fevers chills.  He continues to have some neck pain and states that it is mostly on the left.  Has joint pain in his fingers and some numbness throughout all and reports decreased grip strength on the left side.  He feels all of this is related to his rotator cuff he states he has been told several years ago that he had a rotator cuff problem.  He has had no surgical intervention on the left shoulder.  He has trouble sleeping due to the left shoulder pain.  Right-hand-dominant Left knee pain has been ongoing again for some years.  Knee gives way at times.  No painful popping catching locking.  He notes no swelling of the knee.  He has pain if seated for a long period of time or going up and down stairs.  No surgery on the left knee.  Ranks his knee pain to be 8 out of 10 pain worse.  Pain is throbbing in nature. Patient reports that he worked [employer] for years and walked on stilts.  Review of Systems Denies  any fevers chills.  Objective: Vital Signs: There were no vitals taken for this visit.  Physical Exam Constitutional:      Appearance: He is normal weight. He is not ill-appearing or diaphoretic.  Pulmonary:     Effort: Pulmonary effort is normal.  Neurological:     Mental Status: He is alert and oriented to person, place, and time.  Psychiatric:        Mood and Affect: Mood normal.     Ortho Exam Cervical spine: Good range of motion without pain.  Negative Spurling's nontender medial borders of scapula bilaterally Bilateral shoulders: 5 out of 5 strength with external/internal rotation against resistance.  Positive impingement left only.  Positive the left shoulder with lift off against resistance. Bilateral knees: No abnormal warmth erythema or effusion.  Left knee patellofemoral crepitus.  Hypersensitivity to touch over the left knee particularly over  the patella and manipulation of the patella.  No abnormal warmth erythema or effusion of either knee.  No instability valgus varus stressing of either knee.  Specialty Comments:  No specialty comments available.  Imaging: XR Cervical Spine 2 or 3 views Result Date: 08/04/2023 Cervical spine 2 views: Shows no acute fractures.  No spondylolisthesis.  Loss of lordotic curvature.  Lucency around base of C5 and C6 cannot rule out soft tissue gas formation.  Otherwise displays well-maintained.  No erosive changes of the vertebral bodies.  Anterior vertebral spurring at C5-C6.  XR Knee 1-2 Views Left Result Date: 08/04/2023 Left knee 2 views: Knee is well located.  No acute fractures.  No soft tissue abnormalities.  Minimal patellofemoral changes otherwise the knee joint is well preserved    PMFS History: There are no active problems to display for this patient.  History reviewed. No pertinent past medical history.  Family History  Problem Relation Age of Onset   Diabetes Father     History reviewed. No pertinent surgical history. Social History   Occupational History   Not on file  Tobacco Use   Smoking status: Former    Current packs/day: 0.00    Types: Cigarettes    Quit date: 06/24/2009    Years since quitting: 14.1   Smokeless tobacco: Never  Vaping Use   Vaping status: Never Used  Substance and Sexual Activity   Alcohol use: No   Drug use: No   Sexual activity: Yes

## 2023-08-25 ENCOUNTER — Telehealth: Payer: Self-pay | Admitting: Physician Assistant

## 2023-08-25 NOTE — Telephone Encounter (Signed)
 Patient called and said he needs something for pain. CB#[phone]

## 2023-08-26 ENCOUNTER — Other Ambulatory Visit: Payer: Self-pay | Admitting: Physician Assistant

## 2023-08-26 MED ORDER — TRAMADOL HCL 50 MG PO TABS: 50.0000 mg | ORAL_TABLET | Freq: Four times a day (QID) | ORAL | 0 refills | Status: DC | PRN

## 2023-09-08 ENCOUNTER — Encounter: Payer: Self-pay | Admitting: Physician Assistant

## 2023-09-20 ENCOUNTER — Inpatient Hospital Stay: Admission: RE | Admit: 2023-09-20 | Source: Ambulatory Visit

## 2023-09-28 ENCOUNTER — Other Ambulatory Visit: Payer: Self-pay | Admitting: Physician Assistant

## 2023-09-28 ENCOUNTER — Telehealth: Payer: Self-pay | Admitting: Physician Assistant

## 2023-09-28 MED ORDER — TRAMADOL HCL 50 MG PO TABS: 50.0000 mg | ORAL_TABLET | Freq: Four times a day (QID) | ORAL | 0 refills | Status: DC | PRN

## 2023-09-28 NOTE — Telephone Encounter (Signed)
 Pt states he missed his mri appt and needs a refill on Tramadol 

## 2023-09-30 ENCOUNTER — Ambulatory Visit: Admitting: Physician Assistant

## 2023-10-14 ENCOUNTER — Encounter: Payer: Self-pay | Admitting: Family Medicine

## 2023-10-14 ENCOUNTER — Ambulatory Visit: Admitting: Family Medicine

## 2023-10-14 VITALS — BP 124/70 | HR 66 | Temp 97.9°F | Ht 66.0 in | Wt 127.2 lb

## 2023-10-14 DIAGNOSIS — M545 Low back pain, unspecified: Secondary | ICD-10-CM | POA: Diagnosis not present

## 2023-10-14 DIAGNOSIS — M25562 Pain in left knee: Secondary | ICD-10-CM

## 2023-10-14 DIAGNOSIS — M25512 Pain in left shoulder: Secondary | ICD-10-CM | POA: Diagnosis not present

## 2023-10-14 DIAGNOSIS — G8929 Other chronic pain: Secondary | ICD-10-CM

## 2023-10-14 DIAGNOSIS — G629 Polyneuropathy, unspecified: Secondary | ICD-10-CM | POA: Diagnosis not present

## 2023-10-14 DIAGNOSIS — R634 Abnormal weight loss: Secondary | ICD-10-CM

## 2023-10-14 MED ORDER — TIZANIDINE HCL 4 MG PO TABS: 4.0000 mg | ORAL_TABLET | Freq: Three times a day (TID) | ORAL | 0 refills | Status: AC | PRN

## 2023-10-14 MED ORDER — GABAPENTIN 100 MG PO CAPS: 100.0000 mg | ORAL_CAPSULE | Freq: Three times a day (TID) | ORAL | 3 refills | Status: AC | PRN

## 2023-10-14 NOTE — Progress Notes (Signed)
 Acute Office Visit  Subjective:     Patient ID: Matthew Chapman, male    DOB: 1960/11/23, 63 y.o.   MRN: [id number]  No chief complaint on file.   HPI  Discussed the use of AI scribe software for clinical note transcription with the patient, who gave verbal consent to proceed.  History of Present Illness Matthew Chapman is a 63 year old male with a history of toe injury and arthritis who presents with persistent pain and numbness in his left foot.  Left foot pain and numbness - Persistent pain and numbness affecting all toes of the left foot - Described as a lack of feeling in the toes - No known precipitating cause for symptoms - Symptoms have been ongoing for an extended period - Discomfort significantly disrupts sleep, causing frequent tossing and turning at night  Lower back pain and lower extremity numbness - Lower back pain present for three weeks - Associated numbness in toes - Pain and numbness affect posture - Requires 10-20 minutes of walking to straighten up  Medication intolerance and ineffectiveness - Tramadol  provided no relief for foot pain - Goody powders and ibuprofen  caused allergic reactions, including hives - Codeine caused severe allergic reactions and required hospitalization  Unintentional weight loss - Weight decreased from 130 pounds to 127.2 pounds  Arthralgia and arthritis - Previous x-rays demonstrated arthritis in other joints, but not identified as the cause of current left foot symptoms     ROS Per HPI      Objective:    BP 124/70 (BP Location: Left Arm, Patient Position: Sitting)   Pulse 66   Temp 97.9 F (36.6 C) (Temporal)   Ht 5' 6 (1.676 m)   Wt 127 lb 3.2 oz (57.7 kg)   SpO2 98%   BMI 20.53 kg/m    Physical Exam Vitals and nursing note reviewed.  Constitutional:      General: He is not in acute distress.    Appearance: Normal appearance.  HENT:     Head: Normocephalic and atraumatic.     Nose: Nose normal.   Eyes:     Extraocular Movements: Extraocular movements intact.  Cardiovascular:     Rate and Rhythm: Normal rate and regular rhythm.     Pulses:          Dorsalis pedis pulses are 1+ on the left side.     Heart sounds: Normal heart sounds.  Pulmonary:     Effort: Pulmonary effort is normal. No respiratory distress.     Breath sounds: Normal breath sounds. No wheezing, rhonchi or rales.  Musculoskeletal:     Cervical back: Normal range of motion.     Comments: LROM to L shoulder, LROM to L knee. No swelling, erythema, bruising or obvious deformity to either area.   Feet:     Right foot:     Protective Sensation: 10 sites tested.       Left foot:     Protective Sensation: 10 sites tested.  7 sites sensed.  Lymphadenopathy:     Cervical: No cervical adenopathy.  Skin:    General: Skin is warm and dry.  Neurological:     General: No focal deficit present.     Mental Status: He is alert and oriented to person, place, and time.  Psychiatric:        Mood and Affect: Mood normal.        Behavior: Behavior normal.     No results found for any  visits on 10/14/23.      Assessment & Plan:   Assessment and Plan Assessment & Plan Chronic left shoulder pain Persistent pain causing discomfort and sleep disruption. Previous medications ineffective and caused hives. - Prescribe muscle relaxers. - Provide stretches to relieve nerve pressure.  Chronic left knee pain Pain affecting daily activities. - Prescribe muscle relaxers. - Provide stretches to relieve nerve pressure.  Numbness and tingling in left lower extremity Persistent numbness and tingling, possibly due to nerve compression or neuropathy. - Prescribe gabapentin  with instructions to take as needed up to three times a day if it does not cause excessive drowsiness. - Provide stretches to relieve nerve pressure.  Weight loss - Likely related to stress and caregiving, chronic pain - Will reevaluate in 2 to 3  months  Follow-up Follow-up required to assess medication effectiveness and address pain management. - Send prescriptions to [facility] for delivery.     Chronic bilateral low back pain without sciatica -     tiZANidine  HCl; Take 1 tablet (4 mg total) by mouth every 8 (eight) hours as needed for muscle spasms.  Dispense: 30 tablet; Refill: 0  Chronic left shoulder pain -     tiZANidine  HCl; Take 1 tablet (4 mg total) by mouth every 8 (eight) hours as needed for muscle spasms.  Dispense: 30 tablet; Refill: 0  Chronic pain of left knee -     tiZANidine  HCl; Take 1 tablet (4 mg total) by mouth every 8 (eight) hours as needed for muscle spasms.  Dispense: 30 tablet; Refill: 0  Neuropathy -     Gabapentin ; Take 1 capsule (100 mg total) by mouth 3 (three) times daily as needed.  Dispense: 90 capsule; Refill: 3  Weight loss     Meds ordered this encounter  Medications   tiZANidine  (ZANAFLEX ) 4 MG tablet    Sig: Take 1 tablet (4 mg total) by mouth every 8 (eight) hours as needed for muscle spasms.    Dispense:  30 tablet    Refill:  0   gabapentin  (NEURONTIN ) 100 MG capsule    Sig: Take 1 capsule (100 mg total) by mouth 3 (three) times daily as needed.    Dispense:  90 capsule    Refill:  3    Return in 3 months (on 01/14/2024) for CPE.  Corean LITTIE Ku, FNP

## 2023-10-14 NOTE — Patient Instructions (Addendum)
 Gabapentin  100mg  three times per day as needed for nerve pain  May take tizanidine  4mg  one tablet every 8 hours as needed for muscle spasms and pain  Follow up with me as scheduled

## 2023-10-18 ENCOUNTER — Encounter: Payer: Self-pay | Admitting: Physician Assistant

## 2023-10-21 ENCOUNTER — Ambulatory Visit: Admitting: Physician Assistant

## 2023-11-01 ENCOUNTER — Encounter: Payer: Self-pay | Admitting: Family Medicine

## 2023-11-09 ENCOUNTER — Other Ambulatory Visit

## 2023-11-16 ENCOUNTER — Telehealth: Payer: Self-pay | Admitting: Physician Assistant

## 2023-11-16 NOTE — Telephone Encounter (Signed)
 Patient called and wants to know why did he get denied for the MRI. CB#[id number]

## 2023-11-17 NOTE — Telephone Encounter (Signed)
 I called the patient  The prior authorization for the previously scheduled MRI expired in July, so [facility] submitted a new request for the new appt on 11/08/23. It got denied, due to evolent's not knowing that the MRI is June was not performed ---- it would not approve a new MRI of the same body part so soon. So that appt was cancelled.Tracking [phone]. The patient needs to appeal this decision, himself. I gave him the number to call #[phone], and that he needs to tell them about the MRI not being done in June. Gave him my direct number for if he needs to call me back, or if he needs to give that number to the insurance company. Will check back for any updates.

## 2023-11-23 ENCOUNTER — Ambulatory Visit: Admitting: Physician Assistant

## 2023-12-10 NOTE — Telephone Encounter (Signed)
 I resubmitted the pa request for the MRI Csp, getting it approved this time. I called the patient to advise him of this, and gave him the phone number to [facility] so that he can get the MRI rescheduled. Advised patient to call us  back afterward, to schedule MRI review with Tory. Patient voiced understanding.

## 2023-12-20 ENCOUNTER — Inpatient Hospital Stay: Admission: RE | Admit: 2023-12-20 | Source: Ambulatory Visit

## 2023-12-28 ENCOUNTER — Ambulatory Visit
Admission: RE | Admit: 2023-12-28 | Discharge: 2023-12-28 | Disposition: A | Discharge status: Home / Self Care | Source: Ambulatory Visit | Attending: Physician Assistant | Admitting: Physician Assistant

## 2023-12-28 DIAGNOSIS — M542 Cervicalgia: Secondary | ICD-10-CM

## 2023-12-28 MED ORDER — GADOPICLENOL 0.5 MMOL/ML IV SOLN
7.5000 mL | Freq: Once | INTRAVENOUS | Status: AC | PRN
  Administered 2023-12-28: 6 mL via INTRAVENOUS

## 2024-01-14 ENCOUNTER — Encounter: Admitting: Family Medicine

## 2024-01-14 ENCOUNTER — Telehealth: Payer: Self-pay

## 2024-01-14 NOTE — Telephone Encounter (Signed)
 Noted

## 2024-01-14 NOTE — Telephone Encounter (Signed)
 Copied from CRM [id number]. Topic: General - Other >> Jan 14, 2024  8:28 AM Franky GRADE wrote: Reason for CRM: Patient is calling to advise that transportation did not pick him up today and he could not make the appointment he had set up for 9:00am today. He apologies not making the appointment and rescheduled for 01/27/2024 as it takes time to set up pick up with transportation.

## 2024-01-25 NOTE — Progress Notes (Deleted)
 Annual Physical Exam  Subjective:     Patient ID: Matthew Chapman, male    DOB: Sep 11, 1960, 63 y.o.   MRN: [id number]  No chief complaint on file.   HPI  Discussed the use of AI scribe software for clinical note transcription with the patient, who gave verbal consent to proceed.  History of Present Illness      ROS Per HPI  Most recent fall risk assessment:    06/28/2023    9:43 AM  Fall Risk   Falls in the past year? 0  Number falls in past yr: 0  Injury with Fall? 0    Most recent depression screenings:     No data to display          {VISON DENTAL STD PSA (Optional):27386}  Patient Care Team: Alvia Corean LITTIE, FNP as PCP - General (Family Medicine)   Outpatient Medications Prior to Visit  Medication Sig   gabapentin  (NEURONTIN ) 100 MG capsule Take 1 capsule (100 mg total) by mouth 3 (three) times daily as needed.   tiZANidine  (ZANAFLEX ) 4 MG tablet Take 1 tablet (4 mg total) by mouth every 8 (eight) hours as needed for muscle spasms.   No facility-administered medications prior to visit.       Objective:    There were no vitals taken for this visit.   Physical Exam Vitals and nursing note reviewed.  Constitutional:      General: He is not in acute distress.    Appearance: Normal appearance.  HENT:     Head: Normocephalic and atraumatic.     Right Ear: External ear normal.     Left Ear: External ear normal.     Nose: Nose normal.     Mouth/Throat:     Mouth: Mucous membranes are moist.     Pharynx: Oropharynx is clear.  Eyes:     Extraocular Movements: Extraocular movements intact.  Cardiovascular:     Rate and Rhythm: Normal rate and regular rhythm.     Pulses: Normal pulses.     Heart sounds: Normal heart sounds.  Pulmonary:     Effort: Pulmonary effort is normal. No respiratory distress.     Breath sounds: Normal breath sounds. No wheezing, rhonchi or rales.  Musculoskeletal:        General: Normal range of motion.      Cervical back: Normal range of motion.     Right lower leg: No edema.     Left lower leg: No edema.  Lymphadenopathy:     Cervical: No cervical adenopathy.  Skin:    General: Skin is warm and dry.  Neurological:     General: No focal deficit present.     Mental Status: He is alert and oriented to person, place, and time.  Psychiatric:        Mood and Affect: Mood normal.        Behavior: Behavior normal.     No results found for any visits on 01/27/24.  {Vitals History (Optional):23777}  {Show previous labs (optional):23779}     Assessment & Plan:   Assessment and Plan Assessment & Plan      Health Maintenance  Topic Date Due   HIV Screening  Never done   Hepatitis C Screening  Never done   Colon Cancer Screening  Never done   Pneumococcal Vaccine for age over 56 (1 of 1 - PCV) Never done   Zoster (Shingles) Vaccine (1 of 2) Never done   Flu  Shot  Never done   COVID-19 Vaccine (1 - 2025-26 season) Never done   DTaP/Tdap/Td vaccine (3 - Td or Tdap) 08/16/2029   Hepatitis B Vaccine  Aged Out   HPV Vaccine  Aged Out   Meningitis B Vaccine  Aged Out     Discussed health benefits of physical activity, and encouraged him to engage in regular exercise appropriate for his age and condition.  No orders of the defined types were placed in this encounter.    No orders of the defined types were placed in this encounter.   No follow-ups on file.  Corean LITTIE Ku, FNP

## 2024-01-27 ENCOUNTER — Encounter: Admitting: Family Medicine

## 2024-02-02 ENCOUNTER — Ambulatory Visit: Admitting: Physician Assistant

## 2024-02-04 ENCOUNTER — Encounter: Admitting: Family Medicine

## 2024-02-04 DIAGNOSIS — Z0000 Encounter for general adult medical examination without abnormal findings: Secondary | ICD-10-CM | POA: Insufficient documentation

## 2024-02-04 NOTE — Progress Notes (Deleted)
 Annual Physical Exam  Subjective:     Patient ID: Matthew Chapman, male    DOB: Aug 16, 1960, 63 y.o.   MRN: [id number]  No chief complaint on file.   HPI  Discussed the use of AI scribe software for clinical note transcription with the patient, who gave verbal consent to proceed.  History of Present Illness      ROS Per HPI  Most recent fall risk assessment:    06/28/2023    9:43 AM  Fall Risk   Falls in the past year? 0  Number falls in past yr: 0  Injury with Fall? 0    Most recent depression screenings:     No data to display          {VISON DENTAL STD PSA (Optional):27386}  Patient Care Team: Alvia Corean LITTIE, FNP as PCP - General (Family Medicine)   Outpatient Medications Prior to Visit  Medication Sig  . gabapentin  (NEURONTIN ) 100 MG capsule Take 1 capsule (100 mg total) by mouth 3 (three) times daily as needed.  . tiZANidine  (ZANAFLEX ) 4 MG tablet Take 1 tablet (4 mg total) by mouth every 8 (eight) hours as needed for muscle spasms.   No facility-administered medications prior to visit.       Objective:    There were no vitals taken for this visit.   Physical Exam Vitals and nursing note reviewed.  Constitutional:      General: He is not in acute distress.    Appearance: Normal appearance.  HENT:     Head: Normocephalic and atraumatic.     Right Ear: External ear normal.     Left Ear: External ear normal.     Nose: Nose normal.     Mouth/Throat:     Mouth: Mucous membranes are moist.     Pharynx: Oropharynx is clear.  Eyes:     Extraocular Movements: Extraocular movements intact.  Cardiovascular:     Rate and Rhythm: Normal rate and regular rhythm.     Pulses: Normal pulses.     Heart sounds: Normal heart sounds.  Pulmonary:     Effort: Pulmonary effort is normal. No respiratory distress.     Breath sounds: Normal breath sounds. No wheezing, rhonchi or rales.  Musculoskeletal:        General: Normal range of motion.      Cervical back: Normal range of motion.     Right lower leg: No edema.     Left lower leg: No edema.  Lymphadenopathy:     Cervical: No cervical adenopathy.  Skin:    General: Skin is warm and dry.  Neurological:     General: No focal deficit present.     Mental Status: He is alert and oriented to person, place, and time.  Psychiatric:        Mood and Affect: Mood normal.        Behavior: Behavior normal.     No results found for any visits on 02/04/24.  {Vitals History (Optional):23777}  {Show previous labs (optional):23779}     Assessment & Plan:   Assessment and Plan Assessment & Plan      Health Maintenance  Topic Date Due  . HIV Screening  Never done  . Hepatitis C Screening  Never done  . Colon Cancer Screening  Never done  . Pneumococcal Vaccine for age over 29 (1 of 1 - PCV) Never done  . Zoster (Shingles) Vaccine (1 of 2) Never done  . Flu  Shot  Never done  . COVID-19 Vaccine (1 - 2025-26 season) Never done  . DTaP/Tdap/Td vaccine (3 - Td or Tdap) 08/16/2029  . Hepatitis B Vaccine  Aged Out  . HPV Vaccine  Aged Out  . Meningitis B Vaccine  Aged Out     Discussed health benefits of physical activity, and encouraged him to engage in regular exercise appropriate for his age and condition.  No orders of the defined types were placed in this encounter.    No orders of the defined types were placed in this encounter.   No follow-ups on file.  Corean LITTIE Ku, FNP

## 2024-02-17 ENCOUNTER — Other Ambulatory Visit: Payer: Self-pay | Admitting: Family Medicine

## 2024-02-18 ENCOUNTER — Telehealth: Payer: Self-pay

## 2024-02-18 NOTE — Telephone Encounter (Signed)
-----   Message from Corean LITTIE Ku sent at 02/17/2024  2:18 PM EST ----- MRI results:  - Cervical spinal stenosis (stiffening and straightening of the bones of your neck, they should have a bit more of a natural curve) - Small bone spurs in between the discs - Moderate narrowing of the nerve exit on the left side at the C4 level. - Significant (severe) narrowing on the left side and moderate narrowing on the right side at the C6 level. - Moderate narrowing on the left side at the C7 level.  - Likely causing radiating pain to the left shoulder

## 2024-02-18 NOTE — Telephone Encounter (Signed)
 LVM for patient to return call regarding MRI results.

## 2024-02-28 ENCOUNTER — Ambulatory Visit: Admitting: Family Medicine

## 2024-04-07 ENCOUNTER — Ambulatory Visit: Admitting: Family Medicine

## 2024-04-18 ENCOUNTER — Encounter: Payer: Self-pay | Admitting: Family Medicine

## 2024-04-18 ENCOUNTER — Ambulatory Visit: Admitting: Family Medicine

## 2024-04-18 ENCOUNTER — Telehealth: Payer: Self-pay

## 2024-04-18 NOTE — Telephone Encounter (Signed)
 Patient has 3 no shows, per pcp okay to dismiss.

## 2024-04-25 ENCOUNTER — Telehealth: Payer: Self-pay

## 2024-04-25 NOTE — Telephone Encounter (Signed)
 Copied from CRM [id number]. Topic: Appointments - Scheduling Inquiry for Clinic >> Apr 25, 2024 12:21 PM Rea C wrote: Reason for CRM: PT called and stated that he received letters stating that FNP Corean Ku will no longer be their PCP for himself and Albino Gavel 02/16/1969. Patient is mostly concerned about Albino Gavel due to her need for insulin.   Patient is asking if Corean can please reconsider and accept them as patients again. Patient stated that their transportation scheduled did not come to pick them up for the last appointment and messed them over. He said he is not lying, or trying to make excuses, and really needs help.   They said that they just found a new transportation service and they have it set up now. They would love if Nurse Ku can please reconsider and take them back as patients. They said they are struggling and really need her as their pcp.   [phone] (M) Patient said he will appreciate a call back to know if Nurse Ku will take them as patients again or to further discuss.
